# Patient Record
Sex: Female | Born: 1960 | Race: White | Hispanic: No | Marital: Married | Smoking: Never smoker
Health system: Southern US, Community
[De-identification: ages and names within clinical notes are randomized; demographics above are authoritative.]

---

## 2009-11-26 ENCOUNTER — Ambulatory Visit: Payer: Self-pay | Admitting: Family Medicine

## 2011-08-19 IMAGING — CR DG LUMBAR SPINE COMPLETE 4+V
1 series · 6 of 6 positions shown · non-contrast
Comparison: none

REASON FOR EXAM: Fell off a horse; c/o "back spasms"
COMMENTS:   LMP: One week ago

PROCEDURE:     MDR - M[REDACTED] SPINE WITH OBLIQUES  - November 26, 2009  [DATE]
RESULT:     Comparison: None

[Series 1: view not recorded · 0.17mm/px · 6 of 6 slices shown]
[im 1/6]
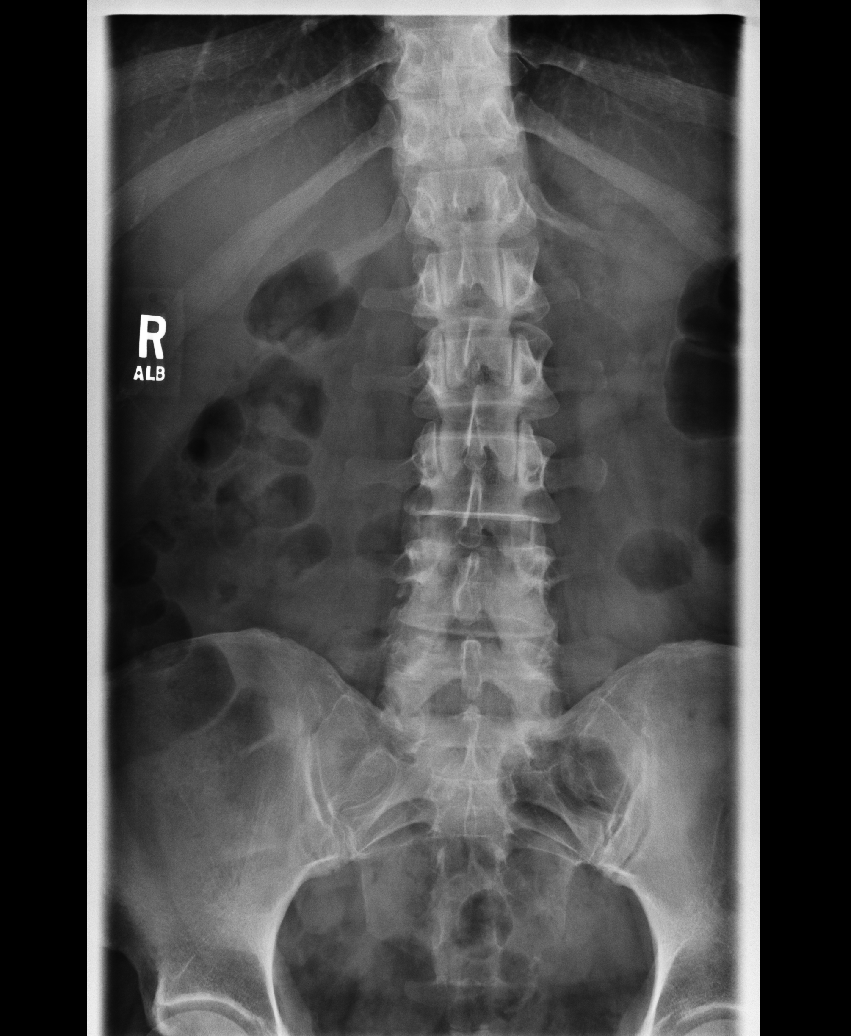
[im 2/6]
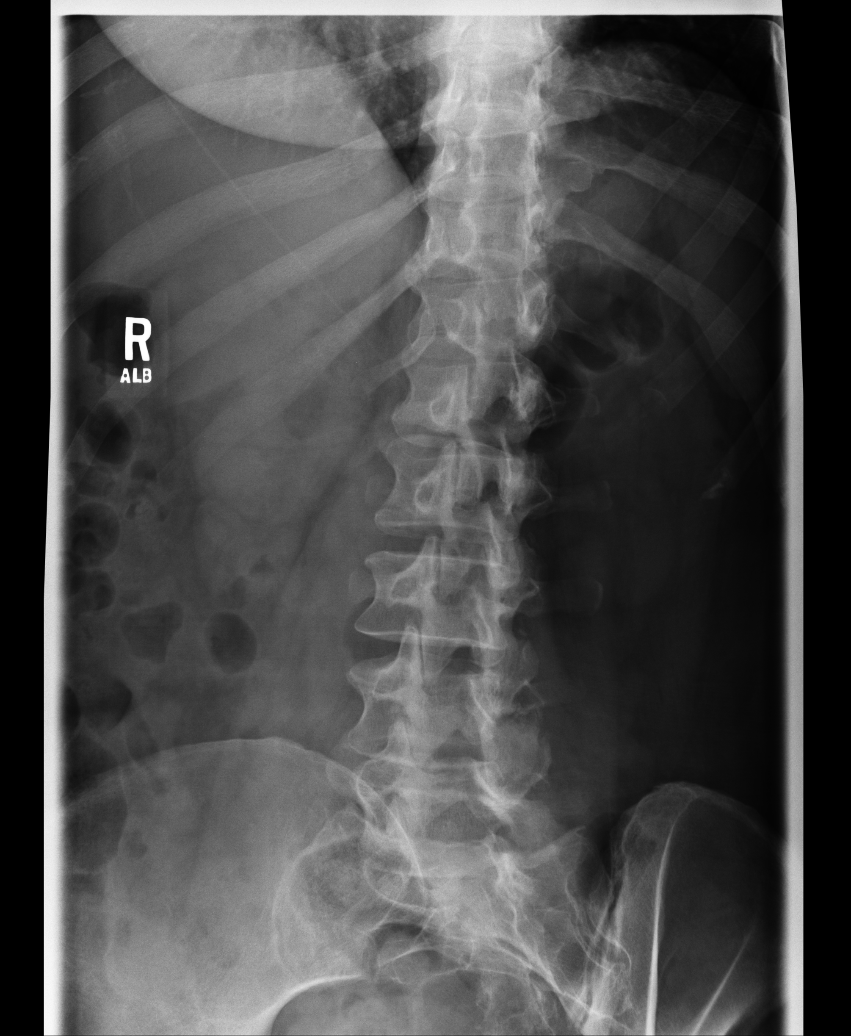
[im 3/6]
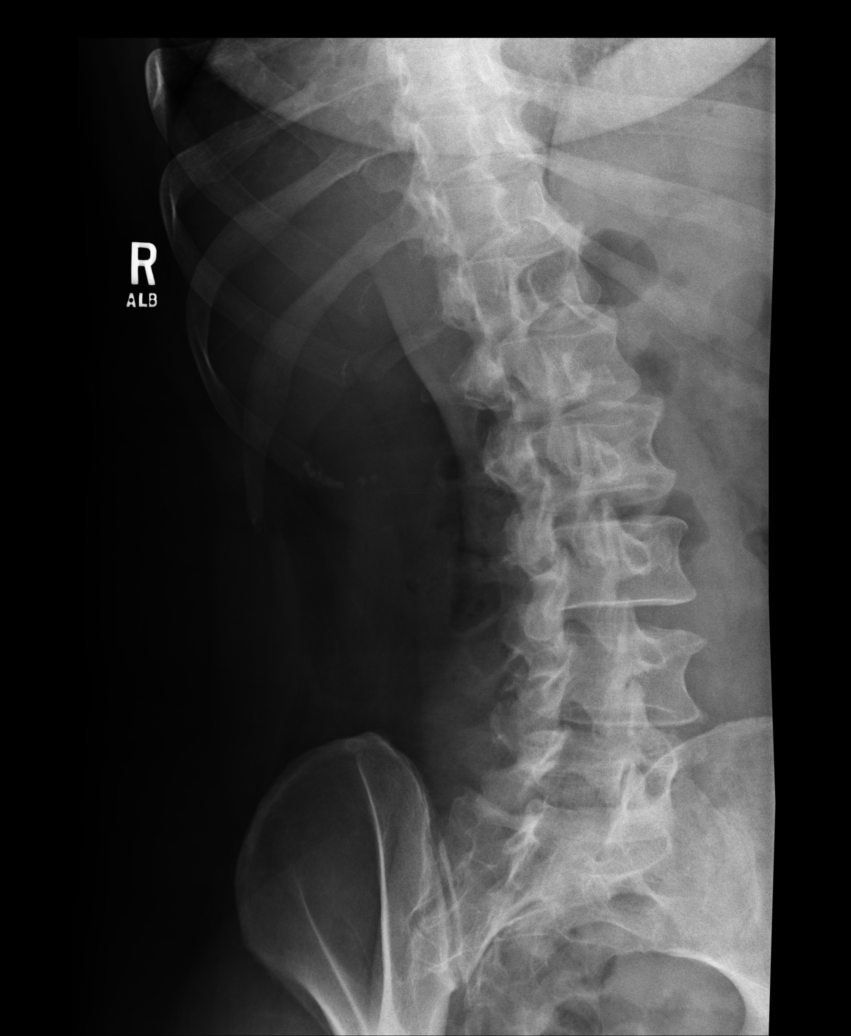
[im 4/6]
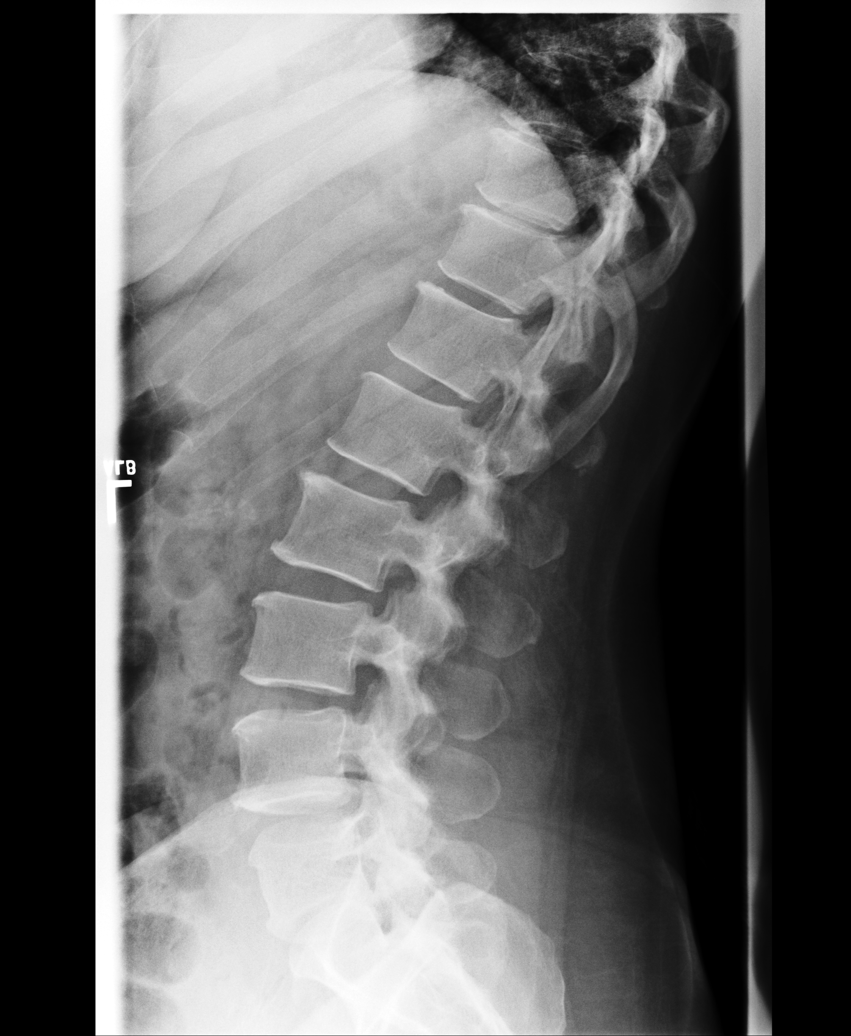
[im 5/6]
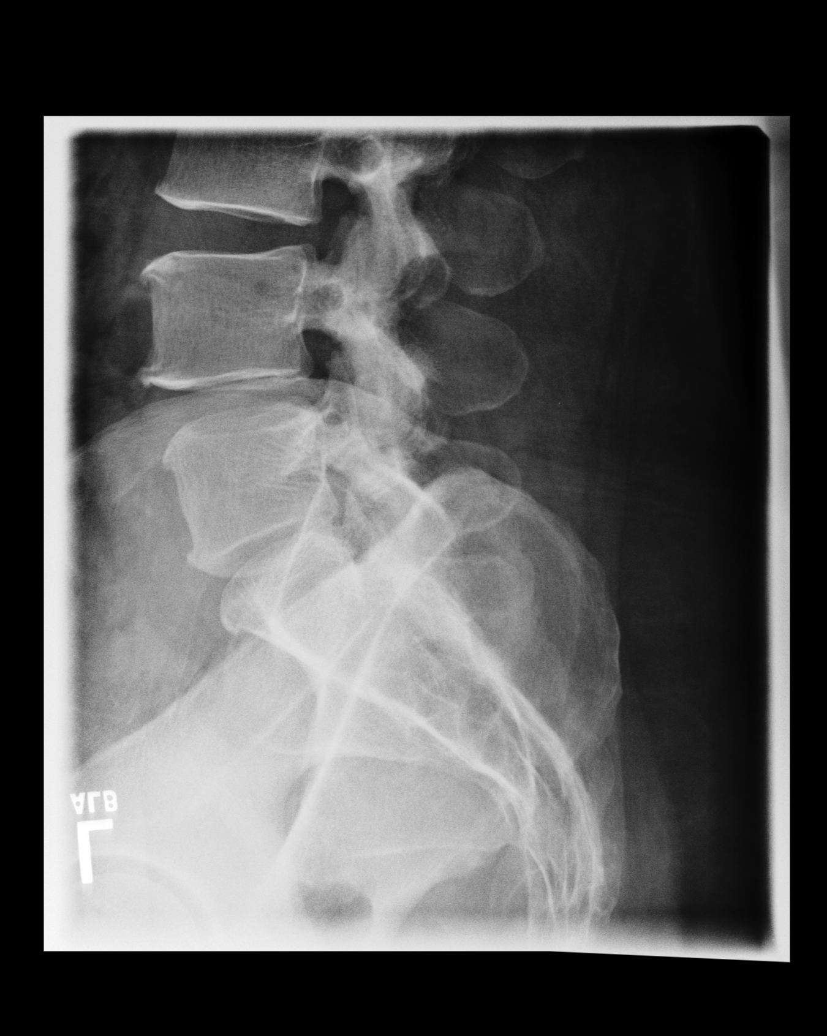
[im 6/6]
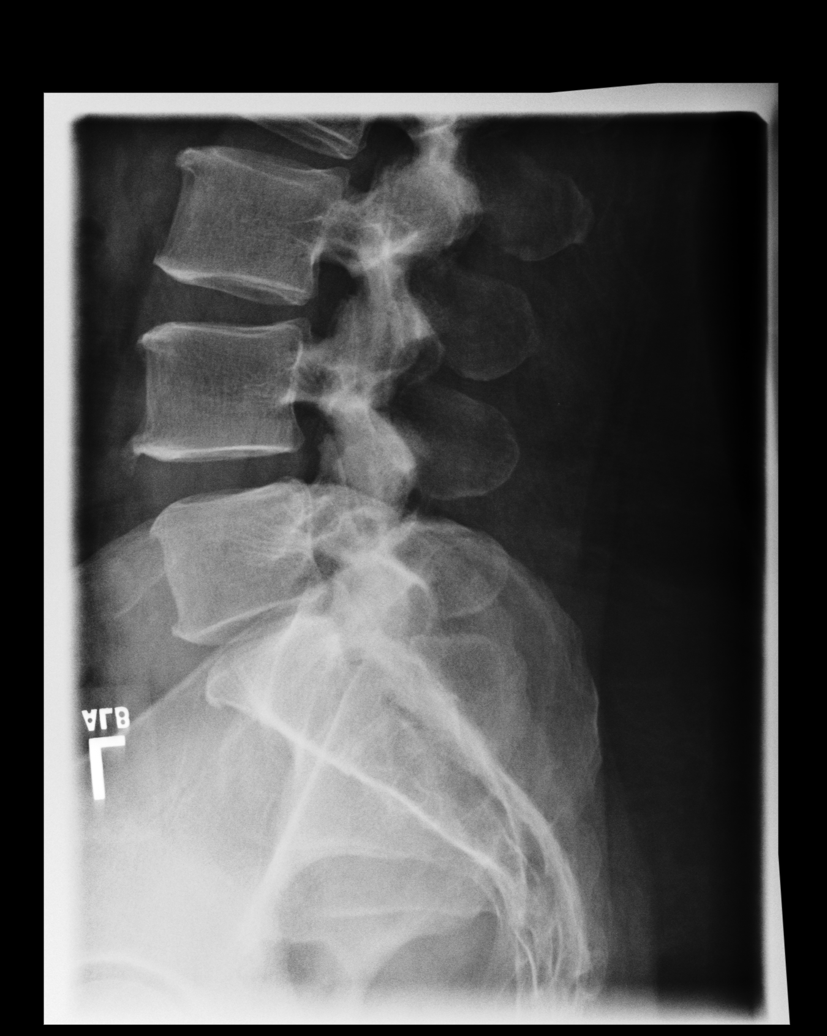

[6 of 6 positions shown; findings below may reference images not displayed]

FINDINGS: AP, lateral, and bilateral obliques views of the lumbar spine and a coned
down view of the lumbosacral junction are provided.

There are 5 nonrib bearing lumbar-type vertebral bodies. The vertebral body
heights are maintained. The alignment is anatomic. There is no
spondylolysis. There is no acute fracture or static listhesis. The disc
spaces are maintained.

The SI joints are unremarkable.
IMPRESSION: 1. No acute osseous abnormality of the lumbar spine.

## 2017-02-18 ENCOUNTER — Telehealth: Payer: Self-pay | Admitting: Physician Assistant

## 2017-02-18 NOTE — Telephone Encounter (Signed)
Error - opened by accident

## 2017-06-22 ENCOUNTER — Ambulatory Visit
Admission: EM | Admit: 2017-06-22 | Discharge: 2017-06-22 | Disposition: A | Discharge status: Home / Self Care | Payer: BLUE CROSS/BLUE SHIELD | Attending: Family Medicine | Admitting: Family Medicine

## 2017-06-22 DIAGNOSIS — M2540 Effusion, unspecified joint: Secondary | ICD-10-CM

## 2017-06-22 MED ORDER — AMOXICILLIN 500 MG PO TABS: 500.0000 mg | ORAL_TABLET | Freq: Three times a day (TID) | ORAL | 0 refills | Status: AC

## 2017-06-22 NOTE — Discharge Instructions (Signed)
Follow up with your PCP.  Discuss referral to local ID (Dr. Sampson GoonFitzgerald, or any of the [facility] physicians in [place]).  Take care  Dr. Adriana Simasook

## 2017-06-22 NOTE — ED Provider Notes (Signed)
[facility]   CSN: [id number] Arrival date & time: 06/22/17  1153  History   Chief Complaint Joint pain and swelling   HPI  57 year old female presents with joint pain and swelling.  Patient states that she is here for a second opinion.  Patient reports a one-month history of joint pain and swelling.  She states that it primarily affects the hands and wrists but also affects the feet as well as her right shoulder.  She has recently seen her primary care physician on 6/5.  Patient had a rheumatologic work-up including ESR, CRP, ANA, rheumatoid factor, Lyme titer.  Work-up was negative except for a positive titer for Lyme disease.  Test was a Western blot.  The actual bands cannot be seen in care everywhere.  I am unsure if she truly had a positive test by [facility] standards.  Patient has been referred to infectious disease.  She has yet to be seen.  Additionally, patient's primary care provider did an E consult with rheumatology.  Rheumatology recommended obtaining CCP as well as x-rays of the hand.  Per rheumatology note, this is unlikely to be related to Lyme.  Patient presents today requesting a second opinion.  She reports continued joint pain and swelling particularly of the hands.  Worse in the morning.  Improves after a few hours of activity.  No reports of redness.  Patient states that her upcoming infectious disease appointment is quite some time away.  She has additional testing with her primary care physician tomorrow.  Patient's primary concern is the positive Lyme studies.  She would like to discuss this today.  No reports of fever.  No weight loss.  No other associated symptoms.  No other complaints.  Of note, patient states that that she did have a preceding respiratory illness prior to her symptoms.  Social History Social History   Tobacco Use  . Smoking status: Never Smoker  . Smokeless tobacco: Never Used  Substance Use Topics  . Alcohol use: Yes  . Drug use: Never     Allergies   Vibramycin  [doxycycline calcium]  Review of Systems Review of Systems  Constitutional: Negative.   Musculoskeletal: Positive for arthralgias and joint swelling.   Physical Exam Triage Vital Signs ED Triage Vitals  Enc Vitals Group     BP 06/22/17 1213 (!) 148/94     Pulse Rate 06/22/17 1213 (!) 56     Resp 06/22/17 1213 16     Temp 06/22/17 1213 97.8 F (36.6 C)     Temp Source 06/22/17 1213 Oral     SpO2 06/22/17 1213 100 %     Weight 06/22/17 1209 206 lb (93.4 kg)     Height 06/22/17 1209 _0  (1.702 m)     Head Circumference --      Peak Flow --      Pain Score 06/22/17 1208 4     Pain Loc --      Pain Edu? --      Excl. in [place]? --    Updated Vital Signs BP (!) 148/94 (BP Location: Left Arm)   Pulse (!) 56   Temp 97.8 F (36.6 C) (Oral)   Resp 16   Ht _1  (1.702 m)   Wt 206 lb (93.4 kg)   SpO2 100%   BMI 32.26 kg/m  Physical Exam  Constitutional: She is oriented to person, place, and time. She appears well-developed. No distress.  HENT:  Head: Normocephalic and atraumatic.  Pulmonary/Chest:  Effort normal. No respiratory distress.  Musculoskeletal:  No apparent synovitis.  Patient does have mild swelling of the MCP joints bilaterally.  Neurological: She is alert and oriented to person, place, and time.  Psychiatric: She has a normal mood and affect. Her behavior is normal.  Nursing note and vitals reviewed.  UC Treatments / Results  Labs (all labs ordered are listed, but only abnormal results are displayed) Labs Reviewed - No data to display  EKG None  Radiology No results found.  Procedures Procedures (including critical care time)  Medications Ordered in UC Medications - No data to display  Initial Impression / Assessment and Plan / UC Course  I have reviewed the triage vital signs and the nursing notes.  Pertinent labs & imaging results that were available during my care of the patient were reviewed by me and considered in  my medical decision making (see chart for details).    57 year old female presents with joint pain and swelling.  We had a lengthy discussion about her recent work-up.  I advised her that I am unsure of her Lyme test was truly positive as I cannot see the actual bands from the Western blot.  Patient is awaiting infectious disease referral.  Patient has upcoming labs and x-rays to be done tomorrow.  Patient would like to discuss an empiric trial of anabolic therapy regarding potential Lyme.  I advised her that this is unlikely and I do not think this is the case.  We discussed the risks and benefits and patient wanted empiric treatment. Patient is allergic to doxy. Rx given for Amox. Advised to discuss with PCP.  Final Clinical Impressions(s) / UC Diagnoses   Final diagnoses:  Joint swelling     Discharge Instructions     Follow up with your PCP.  Discuss referral to local ID (Dr. Ola Spurr, or any of the [place] ID physicians in [place]).  Take care  Dr. Lacinda Axon    ED Prescriptions    Medication Sig Dispense Auth. Provider   amoxicillin (AMOXIL) 500 MG tablet Take 1 tablet (500 mg total) by mouth 3 (three) times daily for 14 days. 42 tablet Coral Spikes, DO     Controlled Substance Prescriptions  Controlled Substance Registry consulted? Not Applicable   Coral Spikes, DO 06/22/17 1419

## 2017-06-22 NOTE — ED Triage Notes (Addendum)
As per patient has some joint swelling onset month, has some weakness and swelling in arms, had some blood work and Lyme IgG came back positive, was referred to infectious specialist but couldn't get appointment until 02/20 and wants second opinion. As per PCP she might have Rheumatoid arthritis. As per patient had tick bite month ago.

## 2018-01-02 ENCOUNTER — Telehealth: Payer: Self-pay
